# Patient Record
Sex: Female | Born: 1955 | Race: White | Hispanic: No | Smoking: Never smoker
Health system: Southern US, Community
[De-identification: ages and names within clinical notes are randomized; demographics above are authoritative.]

## PROBLEM LIST (undated history)

## (undated) DIAGNOSIS — R9230 Dense breasts, unspecified: Secondary | ICD-10-CM

## (undated) DIAGNOSIS — R922 Inconclusive mammogram: Secondary | ICD-10-CM

## (undated) HISTORY — DX: Inconclusive mammogram: R92.2

## (undated) HISTORY — DX: Dense breasts, unspecified: R92.30

---

## 1999-04-23 ENCOUNTER — Encounter: Admission: RE | Admit: 1999-04-23 | Discharge: 1999-04-23 | Payer: Self-pay

## 2000-04-27 ENCOUNTER — Encounter: Payer: Self-pay | Admitting: Unknown Physician Specialty

## 2000-04-27 ENCOUNTER — Encounter: Admission: RE | Admit: 2000-04-27 | Discharge: 2000-04-27 | Payer: Self-pay | Admitting: Unknown Physician Specialty

## 2001-05-09 ENCOUNTER — Encounter: Admission: RE | Admit: 2001-05-09 | Discharge: 2001-05-09 | Payer: Self-pay | Admitting: Unknown Physician Specialty

## 2001-05-09 ENCOUNTER — Encounter: Payer: Self-pay | Admitting: Unknown Physician Specialty

## 2002-05-14 ENCOUNTER — Encounter: Admission: RE | Admit: 2002-05-14 | Discharge: 2002-05-14 | Payer: Self-pay | Admitting: Unknown Physician Specialty

## 2002-05-14 ENCOUNTER — Encounter: Payer: Self-pay | Admitting: Unknown Physician Specialty

## 2004-06-18 ENCOUNTER — Encounter: Admission: RE | Admit: 2004-06-18 | Discharge: 2004-06-18 | Payer: Self-pay | Admitting: Unknown Physician Specialty

## 2005-09-07 ENCOUNTER — Encounter: Admission: RE | Admit: 2005-09-07 | Discharge: 2005-09-07 | Payer: Self-pay | Admitting: Unknown Physician Specialty

## 2006-11-29 ENCOUNTER — Encounter: Admission: RE | Admit: 2006-11-29 | Discharge: 2006-11-29 | Payer: Self-pay | Admitting: Unknown Physician Specialty

## 2007-05-23 ENCOUNTER — Encounter (INDEPENDENT_AMBULATORY_CARE_PROVIDER_SITE_OTHER): Payer: Self-pay | Admitting: Obstetrics and Gynecology

## 2007-05-23 ENCOUNTER — Ambulatory Visit (HOSPITAL_COMMUNITY): Admission: RE | Admit: 2007-05-23 | Discharge: 2007-05-24 | Payer: Self-pay | Admitting: Obstetrics and Gynecology

## 2007-12-11 ENCOUNTER — Encounter: Admission: RE | Admit: 2007-12-11 | Discharge: 2007-12-11 | Payer: Self-pay | Admitting: Obstetrics and Gynecology

## 2008-12-20 ENCOUNTER — Encounter: Admission: RE | Admit: 2008-12-20 | Discharge: 2008-12-20 | Payer: Self-pay | Admitting: Obstetrics and Gynecology

## 2010-01-02 ENCOUNTER — Encounter
Admission: RE | Admit: 2010-01-02 | Discharge: 2010-01-02 | Payer: Self-pay | Source: Home / Self Care | Attending: Obstetrics and Gynecology | Admitting: Obstetrics and Gynecology

## 2010-06-02 NOTE — Op Note (Signed)
[name], [name]             ACCOUNT NO.:  [id number]   MEDICAL RECORD NO.:  [id number]          PATIENT TYPE:  OIB   LOCATION:  9309                          FACILITY:  [facility]   PHYSICIAN:  Guy Sandifer. Henderson Cloud, M.D. DATE OF BIRTH:  05/23/1955   DATE OF PROCEDURE:  05/23/2007  DATE OF DISCHARGE:                               OPERATIVE REPORT   PREOPERATIVE DIAGNOSIS:  Uterine leiomyomata.   POSTOPERATIVE DIAGNOSIS:  Uterine leiomyomata.   PROCEDURE:  Laparoscopically-assisted vaginal hysterectomy with  bilateral salpingo-oophorectomy.   SURGEON:  Guy Sandifer. Henderson Cloud, MD   ASSISTANT:  Duke Salvia. Marcelle Overlie, MD   ANESTHESIA:  General endotracheal intubation.   ESTIMATED BLOOD LOSS:  200 mL.   SPECIMENS:  Uterus, bilateral tubes, and ovaries to pathology.   INDICATIONS AND CONSENT:  The patient is a 55 year old married white  female G3, P2 with recurrent heavy bleeding.  Details were dictated in  the history and physical.  Laparoscopically-assisted vaginal  hysterectomy with bilateral salpingo-oophorectomy is discussed  preoperatively.  Potential risks and complications were discussed  preoperatively including but not limited to infection, organ damage,  bleeding requiring transfusion of blood products with possible HIV and  hepatitis acquisition, DVT, PE, pneumonia, fistula formation,  laparotomy, postoperative pain, or dyspareunia.  All questions have been  answered and consent was signed on the chart.   FINDINGS:  Upper abdomen was grossly normal.  Appendix was normal.  The  uterus was about 6 weeks in size.  Tubes and ovaries are normal.  Anterior and posterior cul-de-sacs are normal.   PROCEDURE:  The patient was taken to the operating room where she was  identified, placed in dorsosupine position and general anesthesia was  induced via endotracheal intubation.  She was then placed in a dorsal  lithotomy position where she was prepped abdominally and vaginally.  Bladder was  straight catheterized.  Hulka tenaculum was placed in uterus  as a manipulator and she was draped in a sterile fashion.  A 1/2% plain  Marcaine was used infraumbilically and suprapubically in the midline.  An infraumbilical incision is then made.  A disposable Veress needle was  placed on the first attempt and a normal syringe and drop test were  noted.  A 2 L of gas was insufflated under low pressure with good  tympany in the right upper quadrant.  Veress needle was removed and a  10/11 Xcel bladeless trocar sleeve was placed using direct visualization  with a diagnostic laparoscope.  After placement, the operative  laparoscope was placed.  The suprapubic incision was made and a 5-mm  Xcel bladeless disposable trocar sleeve was placed under direct  visualization without difficulty.  The above findings were noted.  The  course of the ureters was noted to be clear of the area of the surgery.  Using the gyrus bipolar cautery cutting instrument the right  infundibulopelvic ligament was taken down working across the mesovarium  to the round ligament which was taken down to the level of vesicouterine  peritoneum.  Similar procedure was carried out on the left side.  The  vesicouterine peritoneum was taken down  cephalo-laterally.  Good  hemostasis was maintained.  Suprapubic trocar sleeve was removed.  Instruments are removed and attention was turned to vagina.  Posterior  cul-de-sac was entered sharply.  The cervix was circumscribed with  unipolar cautery.  Mucosa was advanced sharply and bluntly.  Then using  the gyrus bipolar cautery instrument, the uterosacral ligaments followed  by the cardinal ligaments and uterine vessels were taken down  bilaterally.  Fundus was delivered posteriorly.  Remaining ligaments  were cauterized and the specimen was delivered.  The uterosacral  ligaments were then plicated the vaginal cuff bilaterally with 0  Monocryl suture.  All suture will be 0 Monocryl as  otherwise designated.  Cuff was closed with figure-of-eights.  Foley catheter was placed in the  bladder and clear urine was noted.  Attention was returned to the  abdomen.  Pneumoperitoneum was reintroduced and the suprapubic trocar  sleeve was reinserted under direct visualization.  Careful inspection of  the irrigation reveals excellent hemostasis all around.  Inspection  under reduced pneumoperitoneum reveals continued hemostasis.  Excess  fluid is removed.  Instruments were removed.  The skin is closed with  subcuticular 2-0 Vicryl interrupted suture on the umbilical and  suprapubic incisions.  Dermabond was placed on both incisions.  All  counts correct.  The patient was awakened, taken to recovery room in  stable condition.      Guy Sandifer Henderson Cloud, M.D.  Electronically Signed     JET/MEDQ  D:  05/23/2007  T:  05/23/2007  Job:  [id number]

## 2010-06-02 NOTE — H&P (Signed)
[name], [name]             ACCOUNT NO.:  [id number]   MEDICAL RECORD NO.:  [id number]          PATIENT TYPE:  AMB   LOCATION:  SDC                           FACILITY:  [facility]   PHYSICIAN:  Guy Sandifer. Henderson Cloud, M.D. DATE OF BIRTH:  03/12/1955   DATE OF ADMISSION:  DATE OF DISCHARGE:                              HISTORY & PHYSICAL   CHIEF COMPLAINT:  Uterine fibroids.   HISTORY OF PRESENT ILLNESS:  This patient is a 55 year old married white  female G3, P3, who has had recurrent heavy vaginal bleeding.  Ultrasound  on February 25 this year revealed a uterus measuring 8.9 x 3.8 x 4.2 cm.  There is a 2.2-cm subserosal fibroid.  Endometrial stripe is 7.8 mm and  the ovaries appeared normal.  The patient has been on estrogen  replacement therapy for the last 1-1/2 to 2 years.  After discussion of  options, she is being admitted for laparoscopically-assisted vaginal  hysterectomy.  Also after discussion of options, she elects bilateral  salpingo-oophorectomy.  Potential risks and complications have been  discussed preoperatively.   PAST MEDICAL HISTORY:  1. Hiatal hernia.  2. History of headache.   MEDICATIONS:  Prevacid, Vivelle-Dot,  Prometrium, Depakote, baby  aspirin, and glucosamine.   ALLERGIES:  NO KNOWN DRUG ALLERGIES.   OBSTETRICAL HISTORY:  Vaginal delivery x3.   PAST SURGICAL HISTORY:  1. Urethral dilation 1979.  2. Cholecystectomy 2002.  3. Right carpal tunnel surgery 2006.   SOCIAL HISTORY:  Denies tobacco, alcohol, or drug abuse.   FAMILY HISTORY:  Positive for heart disease in parents.  Arthritis and  diabetes in father.  Breast cancer in mother.  Prostate cancer in  father.   REVIEW OF SYSTEMS:  NEUROLOGIC:  History of headache as above.  CARDIAC:  Denies chest pain.  PULMONARY:  Denies shortness of breath.  GASTROINTESTINAL:  Denies recent changes in bowel habits.   PHYSICAL EXAMINATION:  VITAL SIGNS:  Height 5 feet 5-1/2 inch, weight  156 pounds, blood  pressure 124/84.  HEENT:  Without thyromegaly.  LUNGS:  Clear to auscultation.  HEART:  Regular rate rhythm.  BACK:  Without CVA tenderness.  BREAST:  Without mass, traction, or discharge.  ABDOMEN:  Soft, nontender without masses.  PELVIC:  Vulva, vagina, and cervix without lesion.  Uterus is about 6  weeks in size, mobile, and nontender.  Adnexa nontender without masses.  There is a first to second-degree rectocele.  Adnexa nontender without  masses.  EXTREMITIES:  Grossly within normal limits.  NEUROLOGIC:  Grossly within normal limits.   ASSESSMENT:  Uterine leiomyomata.   PLAN:  Laparoscopically-assisted vaginal hysterectomy with bilateral  salpingo-oophorectomy.      Guy Sandifer Henderson Cloud, M.D.  Electronically Signed     JET/MEDQ  D:  05/16/2007  T:  05/17/2007  Job:  [id number]

## 2010-12-07 ENCOUNTER — Other Ambulatory Visit: Payer: Self-pay | Admitting: Obstetrics and Gynecology

## 2010-12-07 DIAGNOSIS — Z1231 Encounter for screening mammogram for malignant neoplasm of breast: Secondary | ICD-10-CM

## 2011-01-04 ENCOUNTER — Ambulatory Visit
Admission: RE | Admit: 2011-01-04 | Discharge: 2011-01-04 | Disposition: A | Discharge status: Home / Self Care | Payer: BC Managed Care – PPO | Source: Ambulatory Visit | Attending: Obstetrics and Gynecology | Admitting: Obstetrics and Gynecology

## 2011-01-04 DIAGNOSIS — Z1231 Encounter for screening mammogram for malignant neoplasm of breast: Secondary | ICD-10-CM

## 2011-11-22 ENCOUNTER — Other Ambulatory Visit: Payer: Self-pay | Admitting: Obstetrics and Gynecology

## 2011-11-22 DIAGNOSIS — Z1231 Encounter for screening mammogram for malignant neoplasm of breast: Secondary | ICD-10-CM

## 2012-01-05 ENCOUNTER — Ambulatory Visit
Admission: RE | Admit: 2012-01-05 | Discharge: 2012-01-05 | Disposition: A | Discharge status: Home / Self Care | Payer: No Typology Code available for payment source | Source: Ambulatory Visit | Attending: Obstetrics and Gynecology | Admitting: Obstetrics and Gynecology

## 2012-01-05 DIAGNOSIS — Z1231 Encounter for screening mammogram for malignant neoplasm of breast: Secondary | ICD-10-CM

## 2012-12-06 ENCOUNTER — Other Ambulatory Visit: Payer: Self-pay

## 2012-12-06 DIAGNOSIS — Z9882 Breast implant status: Secondary | ICD-10-CM

## 2012-12-06 DIAGNOSIS — Z1231 Encounter for screening mammogram for malignant neoplasm of breast: Secondary | ICD-10-CM

## 2012-12-07 ENCOUNTER — Other Ambulatory Visit: Payer: Self-pay | Admitting: Obstetrics and Gynecology

## 2012-12-07 DIAGNOSIS — N6320 Unspecified lump in the left breast, unspecified quadrant: Secondary | ICD-10-CM

## 2012-12-08 ENCOUNTER — Ambulatory Visit
Admission: RE | Admit: 2012-12-08 | Discharge: 2012-12-08 | Disposition: A | Discharge status: Home / Self Care | Payer: No Typology Code available for payment source | Source: Ambulatory Visit | Attending: Obstetrics and Gynecology | Admitting: Obstetrics and Gynecology

## 2012-12-08 ENCOUNTER — Other Ambulatory Visit: Payer: Self-pay | Admitting: Obstetrics and Gynecology

## 2012-12-08 DIAGNOSIS — N6320 Unspecified lump in the left breast, unspecified quadrant: Secondary | ICD-10-CM

## 2012-12-21 ENCOUNTER — Telehealth: Payer: Self-pay | Admitting: Genetic Counselor

## 2012-12-21 NOTE — Telephone Encounter (Signed)
PT CALLED TO SCHEDULE GENETIC FOR 02/23 @ 1 W/KAREN POWELL WELCOME PACKET MAILED.

## 2013-01-08 ENCOUNTER — Ambulatory Visit
Admission: RE | Admit: 2013-01-08 | Discharge: 2013-01-08 | Disposition: A | Discharge status: Home / Self Care | Payer: No Typology Code available for payment source | Source: Ambulatory Visit

## 2013-01-08 DIAGNOSIS — Z9882 Breast implant status: Secondary | ICD-10-CM

## 2013-01-08 DIAGNOSIS — Z1231 Encounter for screening mammogram for malignant neoplasm of breast: Secondary | ICD-10-CM

## 2013-03-15 ENCOUNTER — Encounter: Payer: No Typology Code available for payment source | Admitting: Genetic Counselor

## 2013-03-15 ENCOUNTER — Other Ambulatory Visit: Payer: No Typology Code available for payment source

## 2013-04-12 ENCOUNTER — Telehealth: Payer: Self-pay | Admitting: *Deleted

## 2013-04-12 NOTE — Telephone Encounter (Signed)
Received a request from Clydie BraunKaren to see if pt could come in sooner.  Called and confirmed 04/17/13 genetic appt w/ pt.  Unable to reschedule the genetic appt - emailed Clydie BraunKaren to do it for me.  Rescheduled the lab appt.

## 2013-04-17 ENCOUNTER — Encounter: Payer: Self-pay | Admitting: Genetic Counselor

## 2013-04-17 ENCOUNTER — Other Ambulatory Visit: Payer: No Typology Code available for payment source

## 2013-04-17 ENCOUNTER — Ambulatory Visit (HOSPITAL_BASED_OUTPATIENT_CLINIC_OR_DEPARTMENT_OTHER): Payer: No Typology Code available for payment source | Admitting: Genetic Counselor

## 2013-04-17 DIAGNOSIS — R922 Inconclusive mammogram: Secondary | ICD-10-CM | POA: Insufficient documentation

## 2013-04-17 DIAGNOSIS — Z800 Family history of malignant neoplasm of digestive organs: Secondary | ICD-10-CM

## 2013-04-17 DIAGNOSIS — Z808 Family history of malignant neoplasm of other organs or systems: Secondary | ICD-10-CM

## 2013-04-17 DIAGNOSIS — Z803 Family history of malignant neoplasm of breast: Secondary | ICD-10-CM

## 2013-04-17 DIAGNOSIS — Z8042 Family history of malignant neoplasm of prostate: Secondary | ICD-10-CM

## 2013-04-17 DIAGNOSIS — Z806 Family history of leukemia: Secondary | ICD-10-CM

## 2013-04-17 DIAGNOSIS — IMO0002 Reserved for concepts with insufficient information to code with codable children: Secondary | ICD-10-CM

## 2013-04-17 NOTE — Progress Notes (Signed)
Dr.  Gaetano Net, Marjean Donna, MD requested a consultation for genetic counseling and risk assessment for [name], a 58 y.o. female, for discussion of her personal history of dense breasts and previous breast biopsies and family history of breast, prostate, thyroid, stomach and prostate cancer and melanoma and leukemia.  She presents to clinic today to discuss the possibility of a genetic predisposition to cancer, and to further clarify her risks, as well as her family members' risks for cancer.   HISTORY OF PRESENT ILLNESS: [name] is a 58 y.o. female with no personal history of cancer.  She had 2 simple cysts on her last mammogram, which were drained and benign.  She has had a colonoscopy which was normal.  She received a letter from the breast center stating that she has dense breast tissue and that she may need an annual MRI to help screen for breast cancer.  Past Medical History  Diagnosis Date  . Dense breast     History reviewed. No pertinent past surgical history.  History   Social History  . Marital Status: Widowed    Spouse Name: N/A    Number of Children: 3  . Years of Education: N/A   Occupational History  .     Social History Main Topics  . Smoking status: Never Smoker   . Smokeless tobacco: None  . Alcohol Use: No  . Drug Use: No  . Sexual Activity: None   Other Topics Concern  . None   Social History Narrative  . None    REPRODUCTIVE HISTORY AND PERSONAL RISK ASSESSMENT FACTORS: Menarche was at age 43.   postmenopausal Uterus Intact: no Ovaries Intact: no G3P3A0, first live birth at age 35  She has not previously undergone treatment for infertility.   Oral Contraceptive use: 0 years   She has used HRT in the past.    FAMILY HISTORY:  We obtained a detailed, 4-generation family history.  Significant diagnoses are listed below: Family History  Problem Relation Age of Onset  . Breast cancer Mother 87    had BSO at age 37  . Prostate  cancer Father 77  . Heart Problems Father   . Melanoma Maternal Uncle     dx in his 38s  . Leukemia Maternal Uncle     dx in his 46s  . Leukemia Maternal Uncle     dx in his 83s  . Prostate cancer Other     maternal grandmother's 2 brothers  . Stomach cancer Other     maternal grandmother's sister  . Thyroid cancer Other     maternal grandmother's sister  . Breast cancer Other     paternal grandfather's 2 sisters  . Breast cancer Other     paternal grandmother's mother  . Breast cancer Other     paternal grandfather's sister    Patient's maternal ancestors are of [place], [place], [place], [place] and [place] descent, and paternal ancestors are of [place] and [place] descent. There is no reported Ashkenazi Jewish ancestry. There is no known consanguinity.  GENETIC COUNSELING ASSESSMENT: CHARDONAY SCRITCHFIELD is a 58 y.o. female with a family history of cancer which somewhat suggestive of a hereditary cancer syndrome vs. Familial cancer and predisposition to cancer. We, therefore, discussed and recommended the following at today's visit.   DISCUSSION: We reviewed the characteristics, features and inheritance patterns of hereditary cancer syndromes. We also discussed genetic testing, including the appropriate family members to test, the process of testing,  insurance coverage and turn-around-time for results.   In order to estimate her chance of having a BRCA mutation, we used statistical models (Tyrer Cusik) and laboratory data that take into account her personal medical history, family history and ancestry.  Because each model is different, there can be a lot of variability in the risks they give.  Therefore, these numbers must be considered a rough range and not a precise risk of having a BRCA mutation.  These models estimate that she has approximately a 0.25% chance of having a mutation. Based on this assessment of her family and personal history, genetic testing is not  recommended.  Based on the patient's personal and family history, statistical models (Tyrer Cusik)  and literature data were used to estimate her risk of developing breast cancer. These estimate her lifetime risk of developing breast cancer to be approximately 20.9%. This estimation does not take into account any genetic testing results.  The patient's lifetime breast cancer risk is a preliminary estimate based on available information using one of several models endorsed by the [place] (ACS). The ACS recommends consideration of breast MRI screening as an adjunct to mammography for patients at high risk (defined as 20% or greater lifetime risk). A more detailed breast cancer risk assessment can be considered, if clinically indicated.   PLAN: After considering the risks, benefits, and limitations, KANDAS OLIVETO provided informed consent to pursue genetic testing and the blood sample will be sent to [facility] for analysis of the women's hereditary panel. We discussed the implications of a positive, negative and/ or variant of uncertain significance genetic test result. Results should be available within approximately 3 weeks' time, at which point they will be disclosed by telephone to Chandra Batch, as will any additional recommendations warranted by these results. TONEE SILVERSTEIN will receive a summary of her genetic counseling visit and a copy of her results once available. This information will also be available in Epic. We encouraged OLUWATENIOLA LEITCH to remain in contact with cancer genetics annually so that we can continuously update the family history and inform her of any changes in cancer genetics and testing that may be of benefit for her family. Eiko Mcgowen Mattila's questions were answered to her satisfaction today. Our contact information was provided should additional questions or concerns arise.  The patient was seen for a total of 60 minutes, greater than 50% of  which was spent face-to-face counseling.  This note will also be sent to the referring provider via the electronic medical record. The patient will be supplied with a summary of this genetic counseling discussion as well as educational information on the discussed hereditary cancer syndromes following the conclusion of their visit.   Patient was discussed with Dr. Marcy Panning.   _______________________________________________________________________ For Office Staff:  Number of people involved in session: 1 Was an Intern/ student involved with case: no

## 2013-05-09 ENCOUNTER — Encounter: Payer: Self-pay | Admitting: Genetic Counselor

## 2013-05-09 NOTE — Progress Notes (Signed)
HPI:  [name] was previously seen in the [facility] due to a family history of cancer and concerns regarding a hereditary predisposition to cancer. Please refer to our prior cancer genetics clinic note for more information regarding [name]'s medical, social and family histories, and our assessment and recommendations, at the time. [name] recent genetic test results were disclosed to her, as were recommendations warranted by these results. These results and recommendations are discussed in more detail below.  GENETIC TEST RESULTS: At the time of [name]'s visit, we recommended she pursue genetic testing of the Hereditary Breast and Gynecological cancer gene panel. This test, which included sequencing and deletion/duplication analysis of the genes listed on the test report, was performed at [facility]. Genetic testing was normal, and did not reveal a mutation in these genes. A complete list of all genes tested is located on the test report scanned into EPIC.   We discussed with [name] that since the current genetic testing is not perfect, it is possible there may be a gene mutation in one of these genes that current testing cannot detect, but that chance is small.  We also discussed, that it is possible that another gene that has not yet been discovered, or that we have not yet tested, is responsible for the cancer diagnoses in the family, and it is, therefore, important to remain in touch with cancer genetics in the future so that we can continue to offer [name] the most up to date genetic testing.   CANCER SCREENING RECOMMENDATIONS: This normal result is reassuring and indicates that [name] does not likely have an increased risk of cancer due to a a mutation in one of these genes.  We, therefore, recommended  [name] continue to follow the cancer screening guidelines provided by her primary healthcare providers.   RECOMMENDATIONS FOR FAMILY MEMBERS:   Women in this family might be at some increased risk of developing cancer, over the general population risk, simply due to the family history of cancer.  We recommended women in this family have a yearly mammogram beginning at age 59, an an annual clinical breast exam, and perform monthly breast self-exams. Women in this family should also have a gynecological exam as recommended by their primary provider. All family members should have a colonoscopy by age 59.  FOLLOW-UP: Lastly, we discussed with [name] that cancer genetics is a rapidly advancing field and it is possible that new genetic tests will be appropriate for her and/or her family members in the future. We encouraged her to remain in contact with cancer genetics on an annual basis so we can update her personal and family histories and let her know of advances in cancer genetics that may benefit this family.   Our contact number was provided. [name] questions were answered to her satisfaction, and she knows she is welcome to call us at anytime with additional questions or concerns. This patient was discussed with Dr. Welton FlakesKhan who agrees with the above.   Diane A. Fine, MS, CGC Certified Genetic Counseor phone: [phone] [email or web address]

## 2013-05-24 ENCOUNTER — Encounter: Payer: No Typology Code available for payment source | Admitting: Genetic Counselor

## 2013-05-24 ENCOUNTER — Other Ambulatory Visit: Payer: No Typology Code available for payment source

## 2013-08-13 ENCOUNTER — Other Ambulatory Visit: Payer: Self-pay | Admitting: Gastroenterology

## 2013-08-13 DIAGNOSIS — R1031 Right lower quadrant pain: Secondary | ICD-10-CM

## 2013-08-16 ENCOUNTER — Ambulatory Visit
Admission: RE | Admit: 2013-08-16 | Discharge: 2013-08-16 | Disposition: A | Discharge status: Home / Self Care | Payer: No Typology Code available for payment source | Source: Ambulatory Visit | Attending: Gastroenterology | Admitting: Gastroenterology

## 2013-08-16 DIAGNOSIS — R1031 Right lower quadrant pain: Secondary | ICD-10-CM

## 2013-08-16 MED ORDER — IOHEXOL 300 MG/ML  SOLN
100.0000 mL | Freq: Once | INTRAMUSCULAR | Status: AC | PRN
  Administered 2013-08-16: 100 mL via INTRAVENOUS

## 2014-06-05 ENCOUNTER — Other Ambulatory Visit: Payer: Self-pay | Admitting: Obstetrics and Gynecology

## 2014-06-06 LAB — CYTOLOGY - PAP

## 2014-06-10 DIAGNOSIS — Z1231 Encounter for screening mammogram for malignant neoplasm of breast: Secondary | ICD-10-CM | POA: Insufficient documentation

## 2014-11-29 IMAGING — CT CT ABD-PELV W/ CM
3 of 5 series · 12 of 36 positions shown, 18 images · IV contrast (READICAT/WATER & [ID] OMNI 300)
Comparison: Report only from prior study 04/23/1999.

CLINICAL DATA: Worsening right lower quadrant abdominal pain for 4
months with change in bowel habits. History of diverticulosis,
cholecystectomy and total hysterectomy.

EXAM:
CT ABDOMEN AND PELVIS WITH CONTRAST
TECHNIQUE: Multidetector CT imaging of the abdomen and pelvis was performed
using the standard protocol following bolus administration of
intravenous contrast.
CONTRAST:  100mL OMNIPAQUE IOHEXOL 300 MG/ML  SOLN

[Series 3: abd/pelvis with · axial · 0.74mm/px · z∈[-330,-35]mm · 7 of 79 slices shown, 12 images]
[im 10/79  soft-tissue]
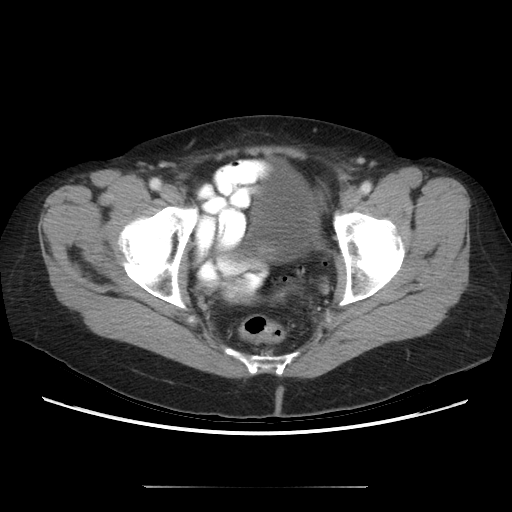
[im 10/79  bone]
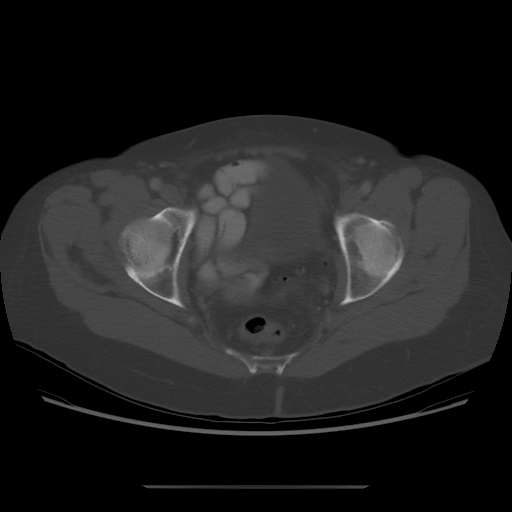
[im 20/79  soft-tissue]
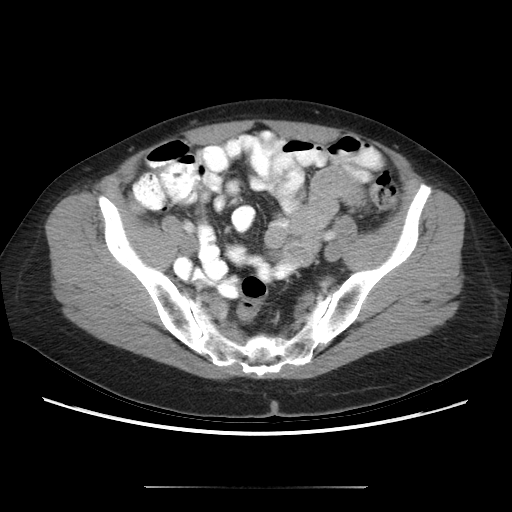
[im 30/79  soft-tissue]
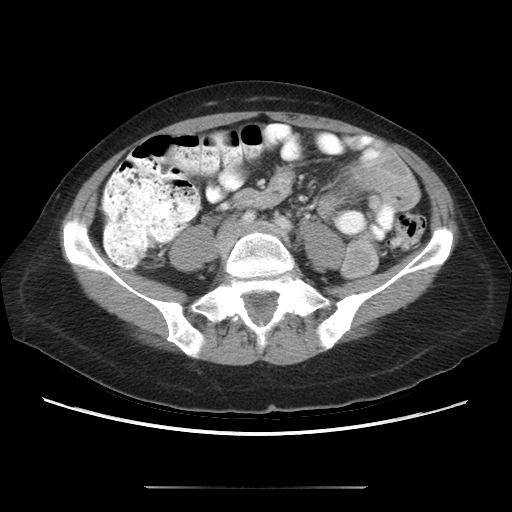
[im 40/79  soft-tissue]
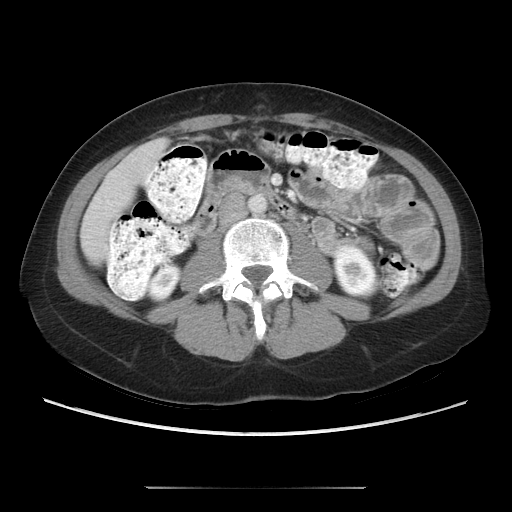
[im 40/79  lung]
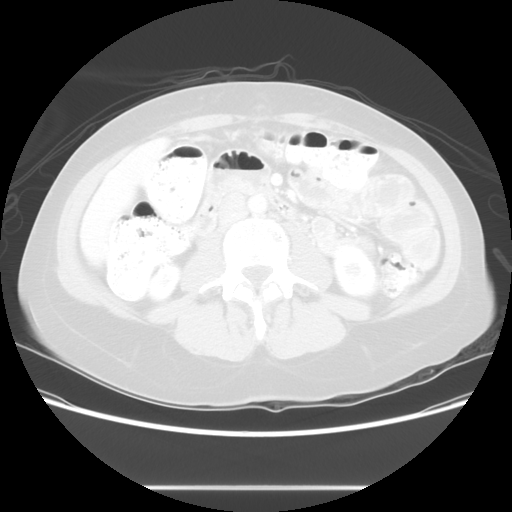
[im 49/79  soft-tissue]
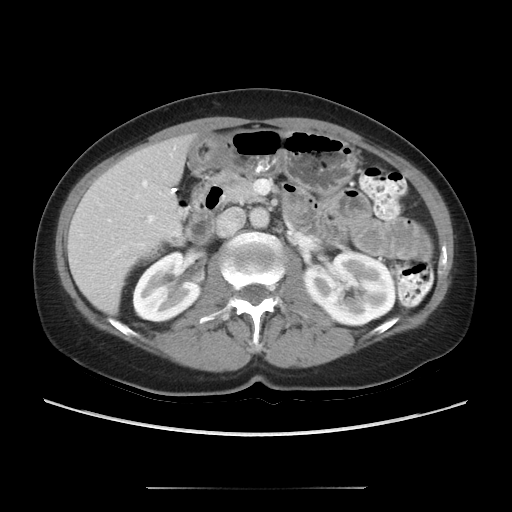
[im 49/79  lung]
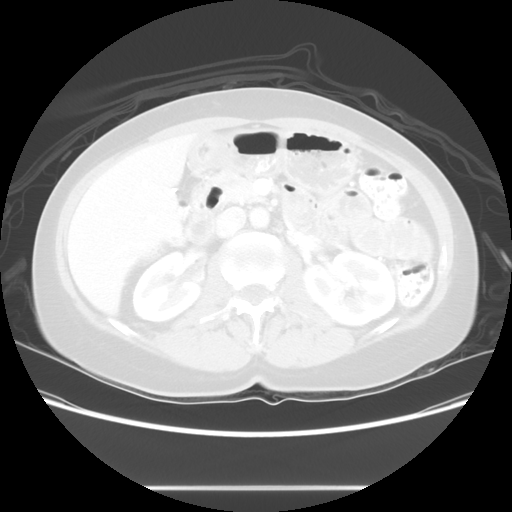
[im 59/79  soft-tissue]
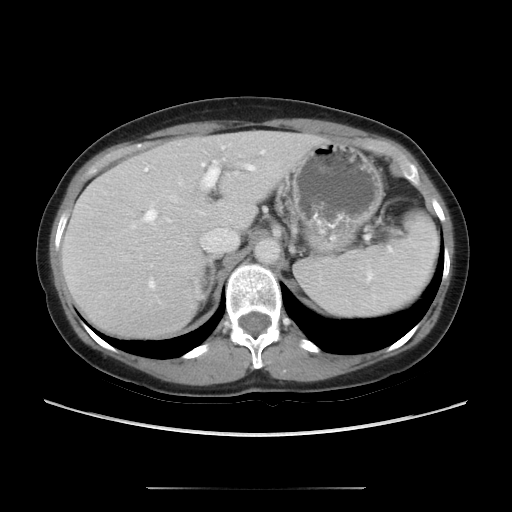
[im 59/79  lung]
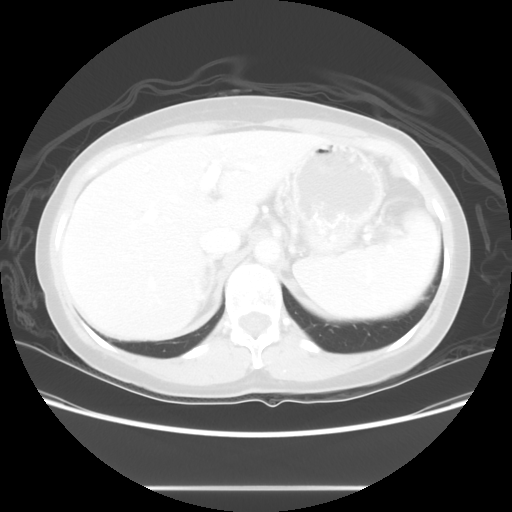
[im 69/79  soft-tissue]
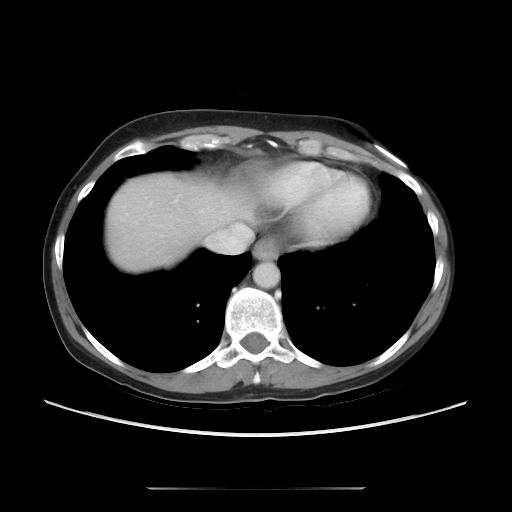
[im 69/79  lung]
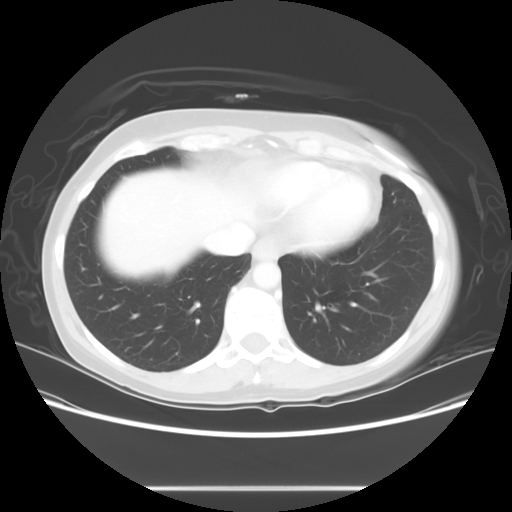

[Series 601: coronal body · coronal · 0.86mm/px · 1 of 104 slices shown, 2 images]
[im 35/104  soft-tissue]
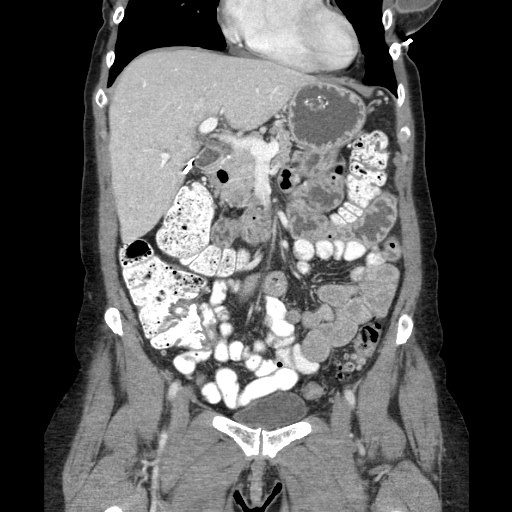
[im 35/104  bone]
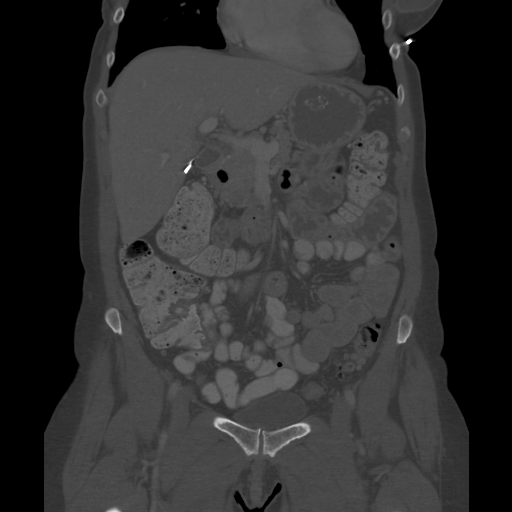

[Series 602: sagittal body · sagittal · 0.86mm/px · 4 of 153 slices shown]
[im 10/153  soft-tissue]
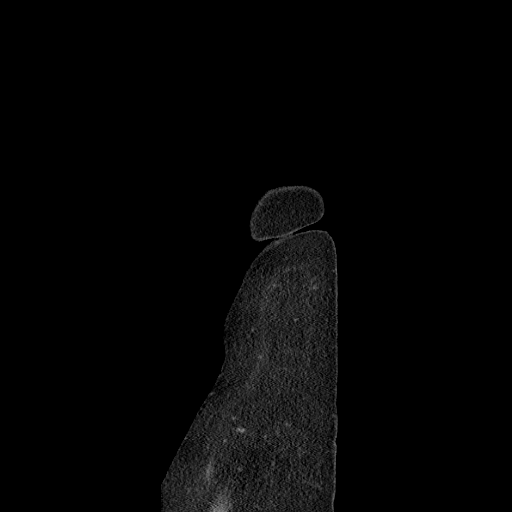
[im 29/153  soft-tissue]
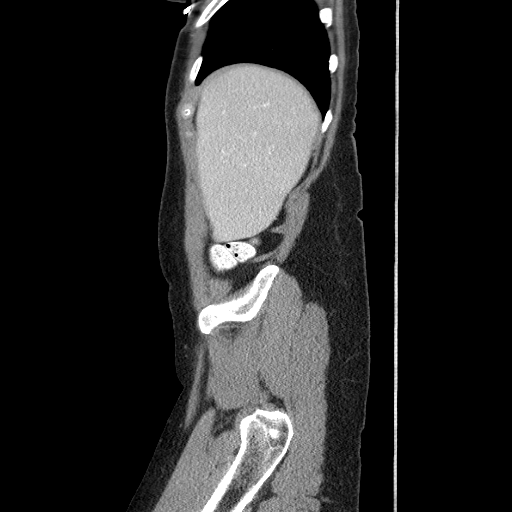
[im 48/153  soft-tissue]
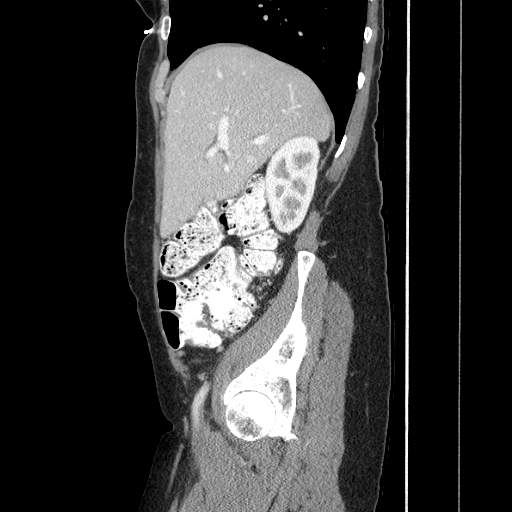
[im 67/153  soft-tissue]
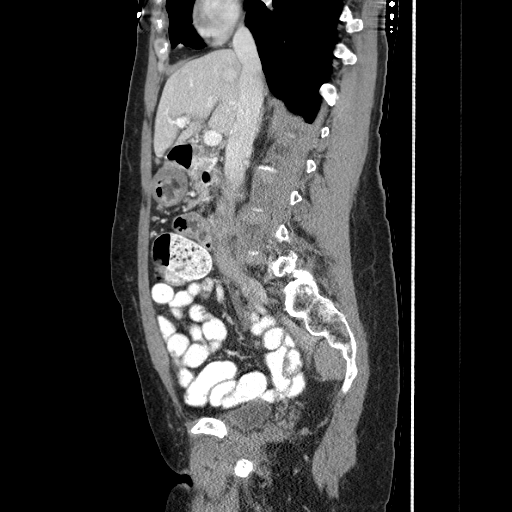

[12 of 36 positions shown; findings below may reference images not displayed]

FINDINGS: Lung bases: Clear. No significant pleural or pericardial effusion. A
left breast implant is partially imaged.

Liver/Biliary/Pancreas: There is a 12 mm low-density lesion in the
dome of the left hepatic lobe on image 12. This demonstrates
questionable peripheral enhancement and may reflect a hemangioma or
cyst. There is a questionable tiny low-density lesion inferiorly in
the right hepatic lobe on image 41. No other liver lesions are
identified. The gallbladder is surgically absent. There is no
biliary dilatation. The pancreas appears normal.

Spleen/Adrenal glands: There are small low-density splenic lesions
which were previously reported and are of doubtful significance. The
largest measures 8 mm centrally on image 22. There is no adrenal
mass.

Kidneys/Ureters/Bladder: Both kidneys appear normal. There is no
hydronephrosis or evidence of urinary tract calculus. The bladder
appears normal.

Bowel/Peritoneum: Suggested wall thickening of the distal stomach
(axial image 31) is less evident on the delayed images and likely
represents peristalsis. There is a diverticulum of the 2nd portion
of the duodenum. The appendix and proximal colon appear normal.
There diverticular changes of the descending and sigmoid colon
without surrounding inflammation. There is no ascites.

Retroperitoneum/Pelvis: There are no enlarged abdominal or pelvic
lymph nodes. No significant vascular findings are present.

Abdominal wall: Small umbilical hernia containing only fat.

Musculoskeletal: No acute or significant osseus findings. Lower
lumbar spine facet degenerative changes are noted with relatively
preserved disc height.
IMPRESSION: 1. No explanation for right lower quadrant abdominal pain
identified. The appendix appears normal. There is no evidence of
right abdominal hernia.
2. Small splenic lesions were previously described and are likely
incidental.
3. Small hepatic lesions were not previously reported, but are
likely benign as well. Lesion in the dome of the left hepatic lobe
may reflect a hemangioma.
4. Descending and sigmoid diverticulosis without adjacent
inflammation.

## 2018-07-12 DIAGNOSIS — M810 Age-related osteoporosis without current pathological fracture: Secondary | ICD-10-CM | POA: Insufficient documentation

## 2018-07-12 DIAGNOSIS — G43909 Migraine, unspecified, not intractable, without status migrainosus: Secondary | ICD-10-CM | POA: Insufficient documentation

## 2023-03-07 ENCOUNTER — Other Ambulatory Visit (HOSPITAL_BASED_OUTPATIENT_CLINIC_OR_DEPARTMENT_OTHER): Payer: Self-pay | Admitting: Registered Nurse

## 2023-03-07 DIAGNOSIS — Z8249 Family history of ischemic heart disease and other diseases of the circulatory system: Secondary | ICD-10-CM

## 2023-03-22 ENCOUNTER — Ambulatory Visit (HOSPITAL_COMMUNITY)
Admission: RE | Admit: 2023-03-22 | Discharge: 2023-03-22 | Disposition: A | Discharge status: Home / Self Care | Payer: Self-pay | Source: Ambulatory Visit | Attending: Registered Nurse | Admitting: Registered Nurse

## 2023-03-22 DIAGNOSIS — Z8249 Family history of ischemic heart disease and other diseases of the circulatory system: Secondary | ICD-10-CM

## 2023-05-19 DIAGNOSIS — I2510 Atherosclerotic heart disease of native coronary artery without angina pectoris: Secondary | ICD-10-CM | POA: Insufficient documentation

## 2023-08-25 DIAGNOSIS — I3139 Other pericardial effusion (noninflammatory): Secondary | ICD-10-CM | POA: Insufficient documentation

## 2023-11-21 DIAGNOSIS — S82843A Displaced bimalleolar fracture of unspecified lower leg, initial encounter for closed fracture: Secondary | ICD-10-CM | POA: Insufficient documentation

## 2023-11-23 ENCOUNTER — Other Ambulatory Visit (INDEPENDENT_AMBULATORY_CARE_PROVIDER_SITE_OTHER): Payer: Self-pay

## 2023-11-23 ENCOUNTER — Encounter: Payer: Self-pay | Admitting: Orthopedic Surgery

## 2023-11-23 ENCOUNTER — Ambulatory Visit: Payer: Self-pay | Admitting: Orthopedic Surgery

## 2023-11-23 VITALS — BP 158/90 | HR 69 | Ht 65.5 in | Wt 126.0 lb

## 2023-11-23 DIAGNOSIS — W010XXA Fall on same level from slipping, tripping and stumbling without subsequent striking against object, initial encounter: Secondary | ICD-10-CM | POA: Diagnosis not present

## 2023-11-23 DIAGNOSIS — M25512 Pain in left shoulder: Secondary | ICD-10-CM

## 2023-11-23 MED ORDER — CELECOXIB 100 MG PO CAPS: 100.0000 mg | ORAL_CAPSULE | Freq: Two times a day (BID) | ORAL | 0 refills | Status: AC | PRN

## 2023-11-23 NOTE — Patient Instructions (Signed)

## 2023-11-23 NOTE — Progress Notes (Signed)
 New Patient Visit  Assessment: [name] is a 68 y.o. female with the following: 1. Acute pain of left shoulder  Plan: [place] fell on an outstretched left arm, approximately 2-3 weeks ago.  She continues to have some pain, which is not persistent.  She has excellent range of motion, as well as excellent strength.  She has some pain with lateral raise, as well as tenderness into the left side of her neck.  I provided reassurance.  Radiographs are without fracture.  There is very little arthritis.  She may have irritated the rotator cuff tendons, but I do not think that she has a large tear.  This was discussed.  I provided her with a prescription for Celebrex, as ibuprofen and naproxen upset her stomach.  She will take this as needed.  Continue with topical treatments.  If she continues to have issues, we can consider formal physical therapy or an injection.  This was discussed.  She states understanding.  She will follow-up as needed.  Follow-up: Return if symptoms worsen or fail to improve.  Subjective:  Chief Complaint  Patient presents with   Shoulder Pain    L after falling over a child gate 2.5 wks ago.     History of Present Illness: [name] is a 68 y.o. female who presents for evaluation of left shoulder pain.  She is right-hand dominant.  Approximately 2-3 weeks ago, she was stepping over a child/pet gate when her foot caught and she lost her balance.  She landed on an outstretched left arm.  She had some pain immediately in the left shoulder.  She did not feel a pop or tear.  Since then, she has had some residual pain.  She notes that when she attempts to lift her arm laterally, but is able to get her arm above her head.  She has some pain that radiates into the left side of her neck.  She has been taking Tylenol, as well as some ibuprofen.  The ibuprofen has started to irritate her stomach.  She has not worked with therapy.  No prior injuries to the left  shoulder.  No numbness or tingling.  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   Review of Systems: No fevers or chills No numbness or tingling No chest pain No shortness of breath No bowel or bladder dysfunction No GI distress No headaches   Medical History:  Past Medical History:  Diagnosis Date   Dense breast     No past surgical history on file.  Family History  Problem Relation Age of Onset   Breast cancer Mother 62       had BSO at age 28   Prostate cancer Father 41   Heart Problems Father    Melanoma Maternal Uncle        dx in his 70s   Leukemia Maternal Uncle        dx in his 52s   Leukemia Maternal Uncle        dx in his 52s   Prostate cancer Other        maternal grandmother's 2 brothers   Stomach cancer Other        maternal grandmother's sister   Thyroid cancer Other        maternal grandmother's sister   Breast cancer Other        paternal grandfather's 2 sisters   Breast  cancer Other        paternal grandmother's mother   Breast cancer Other        paternal grandfather's sister   Social History   Tobacco Use   Smoking status: Never  Substance Use Topics   Alcohol use: No   Drug use: No    Allergies  Allergen Reactions   Morphine Nausea Only   Sulfa Antibiotics Hives   Latex Dermatitis and Other (See Comments)    Current Meds  Medication Sig   alendronate (FOSAMAX) 70 MG tablet Take 70 mg by mouth once a week.   celecoxib (CELEBREX) 100 MG capsule Take 1 capsule (100 mg total) by mouth 2 (two) times daily as needed for up to 14 days.   diazepam (VALIUM) 2 MG tablet Take 2 mg by mouth daily as needed.   estradiol (VIVELLE-DOT) 0.075 MG/24HR for 0 days   pravastatin (PRAVACHOL) 10 MG tablet TAKE 1/2 (ONE-HALF) TABLET BY MOUTH ONCE DAILY   progesterone (PROMETRIUM) 100 MG capsule for 0 days    Objective: BP (!) 158/90   Pulse 69   Ht 5' 5.5 (1.664 m)   Wt 126 lb (57.2 kg)   BMI  20.65 kg/m   Physical Exam:  General: Alert and oriented. and No acute distress. Gait: Normal gait.  Left shoulder without deformity.  No redness.  No tenderness.  Full forward flexion.  Internal rotation to T7.  External rotation of 45 degrees at her side.  Abduction to 100 degrees.  She notes some pain at approximately 70 to 90 degrees of abduction.  5/5 strength in the deltoid, supraspinatus, infraspinatus and subscapularis.  Negative Jobe's.  No impingement signs.  IMAGING: I personally ordered and reviewed the following images   X-rays of the left shoulder were obtained in clinic today.  No acute injuries are noted.  Well-positioned glenohumeral joint.  Very small osteophyte off the inferior aspect of the humeral head.  No evidence of proximal humeral migration.  No bony lesions.  Impression: Negative left shoulder x-ray   New Medications:  Meds ordered this encounter  Medications   celecoxib (CELEBREX) 100 MG capsule    Sig: Take 1 capsule (100 mg total) by mouth 2 (two) times daily as needed for up to 14 days.    Dispense:  28 capsule    Refill:  0      Oneil DELENA Horde, MD  11/23/2023 9:30 AM

## 2023-11-29 ENCOUNTER — Ambulatory Visit: Payer: Self-pay | Admitting: Orthopedic Surgery
